# Patient Record
Sex: Female | Born: 1990 | Race: White | Hispanic: No | Marital: Single | Smoking: Never smoker
Health system: Southern US, Community
[De-identification: ages and names within clinical notes are randomized; demographics above are authoritative.]

---

## 2015-01-07 ENCOUNTER — Encounter: Payer: Self-pay | Admitting: Emergency Medicine

## 2015-01-07 ENCOUNTER — Ambulatory Visit
Admission: EM | Admit: 2015-01-07 | Discharge: 2015-01-07 | Disposition: A | Discharge status: Home / Self Care | Payer: Medicaid Other | Attending: Family Medicine | Admitting: Family Medicine

## 2015-01-07 DIAGNOSIS — J069 Acute upper respiratory infection, unspecified: Secondary | ICD-10-CM

## 2015-01-07 NOTE — ED Provider Notes (Signed)
CSN: [id number]     Arrival date & time 01/07/15  1510 History   First MD Initiated Contact with Patient 01/07/15 1630     Chief Complaint  Patient presents with  . Sore Throat  . Headache  . Nasal Congestion   (Consider location/radiation/quality/duration/timing/severity/associated sxs/prior Treatment) HPI Comments: 24 yo female with a 2 days h/o runny nose and congestion. States yesterday had sore throat but now resolved. Denies any fevers, chills, chest pains, shortness of breath, cough.   The history is provided by the patient.    History reviewed. No pertinent past medical history. History reviewed. No pertinent past surgical history. History reviewed. No pertinent family history. Social History  Substance Use Topics  . Smoking status: Never Smoker   . Smokeless tobacco: None  . Alcohol Use: No   OB History    No data available     Review of Systems  Allergies  Review of patient's allergies indicates no known allergies.  Home Medications   Prior to Admission medications   Medication Sig Start Date End Date Taking? Authorizing Provider  levonorgestrel (MIRENA) 20 MCG/24HR IUD 1 each by Intrauterine route once.   Yes Historical Provider, MD   Meds Ordered and Administered this Visit  Medications - No data to display  BP 138/74 mmHg  Pulse 106  Temp(Src) 98.6 F (37 C) (Tympanic)  Resp 16  Ht  (1.549 m)  Wt 182 lb (82.555 kg)  BMI 34.41 kg/m2  SpO2 99% No data found.   Physical Exam  Constitutional: She appears well-developed and well-nourished. No distress.  HENT:  Head: Normocephalic and atraumatic.  Right Ear: Tympanic membrane, external ear and ear canal normal.  Left Ear: Tympanic membrane, external ear and ear canal normal.  Nose: Rhinorrhea present. No nose lacerations, sinus tenderness, nasal deformity, septal deviation or nasal septal hematoma. No epistaxis.  No foreign bodies.  Mouth/Throat: Uvula is midline and mucous membranes are  normal. Posterior oropharyngeal erythema present. No oropharyngeal exudate, posterior oropharyngeal edema or tonsillar abscesses.  Eyes: Conjunctivae and EOM are normal. Pupils are equal, round, and reactive to light. Right eye exhibits no discharge. Left eye exhibits no discharge. No scleral icterus.  Neck: Normal range of motion. Neck supple. No thyromegaly present.  Cardiovascular: Normal rate, regular rhythm and normal heart sounds.   Pulmonary/Chest: Effort normal and breath sounds normal. No respiratory distress. She has no wheezes. She has no rales.  Lymphadenopathy:    She has no cervical adenopathy.  Skin: She is not diaphoretic.  Nursing note and vitals reviewed.   ED Course  Procedures (including critical care time)  Labs Review Labs Reviewed - No data to display  Imaging Review No results found.   Visual Acuity Review  Right Eye Distance:   Left Eye Distance:   Bilateral Distance:    Right Eye Near:   Left Eye Near:    Bilateral Near:         MDM   1. Viral URI    Discharge Medication List as of 01/07/2015  5:00 PM     Plan: 1.  diagnosis reviewed with patient 2. Recommend supportive treatment with otc analgesics, rest, increased fluids 3. F/u prn if symptoms worsen or don't improve    Payton Mccallum, MD 01/07/15 [id number]

## 2015-01-07 NOTE — ED Notes (Signed)
Patient c/o sore throat, runny nose, and HAs since yesterday.

## 2015-03-09 ENCOUNTER — Emergency Department
Admission: EM | Admit: 2015-03-09 | Discharge: 2015-03-09 | Disposition: A | Discharge status: Home / Self Care | Payer: Medicaid Other | Attending: Emergency Medicine | Admitting: Emergency Medicine

## 2015-03-09 ENCOUNTER — Encounter: Payer: Self-pay | Admitting: Emergency Medicine

## 2015-03-09 DIAGNOSIS — N390 Urinary tract infection, site not specified: Secondary | ICD-10-CM | POA: Diagnosis not present

## 2015-03-09 DIAGNOSIS — Z3202 Encounter for pregnancy test, result negative: Secondary | ICD-10-CM | POA: Insufficient documentation

## 2015-03-09 DIAGNOSIS — N939 Abnormal uterine and vaginal bleeding, unspecified: Secondary | ICD-10-CM | POA: Insufficient documentation

## 2015-03-09 DIAGNOSIS — R350 Frequency of micturition: Secondary | ICD-10-CM | POA: Diagnosis present

## 2015-03-09 DIAGNOSIS — Z79899 Other long term (current) drug therapy: Secondary | ICD-10-CM | POA: Diagnosis not present

## 2015-03-09 LAB — CBC
HEMATOCRIT: 40.9 % (ref 35.0–47.0)
HEMOGLOBIN: 13.4 g/dL (ref 12.0–16.0)
MCH: 26.9 pg (ref 26.0–34.0)
MCHC: 32.8 g/dL (ref 32.0–36.0)
MCV: 82.2 fL (ref 80.0–100.0)
Platelets: 246 10*3/uL (ref 150–440)
RBC: 4.97 MIL/uL (ref 3.80–5.20)
RDW: 14.6 % — AB (ref 11.5–14.5)
WBC: 10.4 10*3/uL (ref 3.6–11.0)

## 2015-03-09 LAB — BASIC METABOLIC PANEL
ANION GAP: 6 (ref 5–15)
BUN: 15 mg/dL (ref 6–20)
CALCIUM: 9.8 mg/dL (ref 8.9–10.3)
CHLORIDE: 105 mmol/L (ref 101–111)
CO2: 26 mmol/L (ref 22–32)
Creatinine, Ser: 0.68 mg/dL (ref 0.44–1.00)
GFR calc non Af Amer: >60 mL/min (ref >60)
GLUCOSE: 101 mg/dL — AB (ref 65–99)
POTASSIUM: 3.6 mmol/L (ref 3.5–5.1)
Sodium: 137 mmol/L (ref 135–145)

## 2015-03-09 LAB — URINALYSIS COMPLETE WITH MICROSCOPIC (ARMC ONLY)
Bilirubin Urine: NEGATIVE
GLUCOSE, UA: NEGATIVE mg/dL
Ketones, ur: NEGATIVE mg/dL
Nitrite: NEGATIVE
PROTEIN: 100 mg/dL — AB
Specific Gravity, Urine: 1.026 (ref 1.005–1.030)
pH: 6 (ref 5.0–8.0)

## 2015-03-09 LAB — POCT PREGNANCY, URINE: Preg Test, Ur: NEGATIVE

## 2015-03-09 MED ORDER — PHENAZOPYRIDINE HCL 100 MG PO TABS: 100.0000 mg | ORAL_TABLET | Freq: Three times a day (TID) | ORAL | Status: DC | PRN

## 2015-03-09 MED ORDER — SULFAMETHOXAZOLE-TRIMETHOPRIM 800-160 MG PO TABS
1.0000 | ORAL_TABLET | Freq: Two times a day (BID) | ORAL | Status: DC
Start: 2015-03-09 — End: 2015-05-13

## 2015-03-09 MED ORDER — PHENAZOPYRIDINE HCL 200 MG PO TABS
ORAL_TABLET | ORAL | Status: AC
  Filled 2015-03-09: qty 1

## 2015-03-09 MED ORDER — SULFAMETHOXAZOLE-TRIMETHOPRIM 800-160 MG PO TABS
1.0000 | ORAL_TABLET | Freq: Once | ORAL | Status: AC
  Administered 2015-03-09: 1 via ORAL
  Filled 2015-03-09: qty 1

## 2015-03-09 MED ORDER — PHENAZOPYRIDINE HCL 100 MG PO TABS
95.0000 mg | ORAL_TABLET | Freq: Once | ORAL | Status: AC
  Administered 2015-03-09: 100 mg via ORAL

## 2015-03-09 NOTE — ED Notes (Signed)
Pt reports dysuria for 2 weeks.  Pt taking cranberry pills without relief.   Pt reports bleeding when wiping.  Pt has an IUD.  No n/v/d.  No vag discharge.  No abd pain or back pain.

## 2015-03-09 NOTE — Discharge Instructions (Signed)
Dysuria °Dysuria is pain or discomfort while urinating. The pain or discomfort may be felt in the tube that carries urine out of the bladder (urethra) or in the surrounding tissue of the genitals. The pain may also be felt in the groin area, lower abdomen, and lower back. You may have to urinate frequently or have the sudden feeling that you have to urinate (urgency). Dysuria can affect both men and women, but is more common in women. °Dysuria can be caused by many different things, including: °· Urinary tract infection in women. °· Infection of the kidney or bladder. °· Kidney stones or bladder stones. °· Certain sexually transmitted infections (STIs), such as chlamydia. °· Dehydration. °· Inflammation of the vagina. °· Use of certain medicines. °· Use of certain soaps or scented products that cause irritation. °HOME CARE INSTRUCTIONS °Watch your dysuria for any changes. The following actions may help to reduce any discomfort you are feeling: °· Drink enough fluid to keep your urine clear or pale yellow. °· Empty your bladder often. Avoid holding urine for long periods of time. °· After a bowel movement or urination, women should cleanse from front to back, using each tissue only once. °· Empty your bladder after sexual intercourse. °· Take medicines only as directed by your health care provider. °· If you were prescribed an antibiotic medicine, finish it all even if you start to feel better. °· Avoid caffeine, tea, and alcohol. They can irritate the bladder and make dysuria worse. In men, alcohol may irritate the prostate. °· Keep all follow-up visits as directed by your health care provider. This is important. °· If you had any tests done to find the cause of dysuria, it is your responsibility to obtain your test results. Ask the lab or department performing the test when and how you will get your results. Talk with your health care provider if you have any questions about your results. °SEEK MEDICAL CARE  IF: °· You develop pain in your back or sides. °· You have a fever. °· You have nausea or vomiting. °· You have blood in your urine. °· You are not urinating as often as you usually do. °SEEK IMMEDIATE MEDICAL CARE IF: °· You pain is severe and not relieved with medicines. °· You are unable to hold down any fluids. °· You or someone else notices a change in your mental function. °· You have a rapid heartbeat at rest. °· You have shaking or chills. °· You feel extremely weak. °  °This information is not intended to replace advice given to you by your health care provider. Make sure you discuss any questions you have with your health care provider. °  °Document Released: 12/31/2003 Document Revised: 04/24/2014 Document Reviewed: 11/27/2013 °Elsevier Interactive Patient Education ©2016 [employer] ° °Urinary Tract Infection °Urinary tract infections (UTIs) can develop anywhere along your urinary tract. Your urinary tract is your body's drainage system for removing wastes and extra water. Your urinary tract includes two kidneys, two ureters, a bladder, and a urethra. Your kidneys are a pair of bean-shaped organs. Each kidney is about the size of your fist. They are located below your ribs, one on each side of your spine. °CAUSES °Infections are caused by microbes, which are microscopic organisms, including fungi, viruses, and bacteria. These organisms are so small that they can only be seen through a microscope. Bacteria are the microbes that most commonly cause UTIs. °SYMPTOMS  °Symptoms of UTIs may vary by age and gender of the patient   and by the location of the infection. Symptoms in young women typically include a frequent and intense urge to urinate and a painful, burning feeling in the bladder or urethra during urination. Older women and men are more likely to be tired, shaky, and weak and have muscle aches and abdominal pain. A fever may mean the infection is in your kidneys. Other symptoms of a kidney  infection include pain in your back or sides below the ribs, nausea, and vomiting. °DIAGNOSIS °To diagnose a UTI, your caregiver will ask you about your symptoms. Your caregiver will also ask you to provide a urine sample. The urine sample will be tested for bacteria and white blood cells. White blood cells are made by your body to help fight infection. °TREATMENT  °Typically, UTIs can be treated with medication. Because most UTIs are caused by a bacterial infection, they usually can be treated with the use of antibiotics. The choice of antibiotic and length of treatment depend on your symptoms and the type of bacteria causing your infection. °HOME CARE INSTRUCTIONS °· If you were prescribed antibiotics, take them exactly as your caregiver instructs you. Finish the medication even if you feel better after you have only taken some of the medication. °· Drink enough water and fluids to keep your urine clear or pale yellow. °· Avoid caffeine, tea, and carbonated beverages. They tend to irritate your bladder. °· Empty your bladder often. Avoid holding urine for long periods of time. °· Empty your bladder before and after sexual intercourse. °· After a bowel movement, women should cleanse from front to back. Use each tissue only once. °SEEK MEDICAL CARE IF:  °· You have back pain. °· You develop a fever. °· Your symptoms do not begin to resolve within 3 days. °SEEK IMMEDIATE MEDICAL CARE IF:  °· You have severe back pain or lower abdominal pain. °· You develop chills. °· You have nausea or vomiting. °· You have continued burning or discomfort with urination. °MAKE SURE YOU:  °· Understand these instructions. °· Will watch your condition. °· Will get help right away if you are not doing well or get worse. °  °This information is not intended to replace advice given to you by your health care provider. Make sure you discuss any questions you have with your health care provider. °  °Document Released: 01/11/2005 Document  Revised: 12/23/2014 Document Reviewed: 05/12/2011 °Elsevier Interactive Patient Education ©2016 Elsevier Inc. ° °

## 2015-03-09 NOTE — ED Notes (Addendum)
Patient ambulatory to triage with steady gait, without difficulty or distress noted; pt reports dysuria x 2 wks, now with vag bleeding/pain & pressure

## 2015-03-09 NOTE — ED Provider Notes (Signed)
[facility] Emergency Department Provider Note  ____________________________________________  Time seen: Approximately 135 AM  I have reviewed the triage vital signs and the nursing notes.   HISTORY  Chief Complaint Urinary Frequency and Vaginal Bleeding    HPI Barbara Mueller is a 24 y.o. female who comes into the hospital today with a concern that she has a bad UTI. The patient is had pain with urination for the last 2 weeks and small amounts of urine. The patient reports that she has an IUD and also has had some vaginal bleeding in the last week. She reports that she's had no cycle from some time but was concerned when she started having some light bleeding a week ago. She reports that the bleeding became heavier but never enough that she needs the pad and then became light again. The patient denies any abdominal or back pain. The patient thought that her UTI was getting better but then realized it was getting worse. The patient has been taking cranberry pills and Tylenol she's had some shooting pains up into her abdomen but nothing currently. The patient denies any fevers or vomiting. The patient is here for evaluation.   History reviewed. No pertinent past medical history.  There are no active problems to display for this patient.   History reviewed. No pertinent past surgical history.  Current Outpatient Rx  Name  Route  Sig  Dispense  Refill  . levonorgestrel (MIRENA) 20 MCG/24HR IUD   Intrauterine   1 each by Intrauterine route once.         . phenazopyridine (PYRIDIUM) 100 MG tablet   Oral   Take 1 tablet (100 mg total) by mouth 3 (three) times daily as needed for pain.   20 tablet   0   . sulfamethoxazole-trimethoprim (BACTRIM DS,SEPTRA DS) 800-160 MG tablet   Oral   Take 1 tablet by mouth 2 (two) times daily.   10 tablet   0     Allergies Review of patient's allergies indicates no known allergies.  No family history on  file.  Social History Social History  Substance Use Topics  . Smoking status: Never Smoker   . Smokeless tobacco: None  . Alcohol Use: No    Review of Systems Constitutional: No fever/chills Eyes: No visual changes. ENT: No sore throat. Cardiovascular: Denies chest pain. Respiratory: Denies shortness of breath. Gastrointestinal: No abdominal pain.  No nausea, no vomiting.  No diarrhea.  No constipation. Genitourinary: dysuria and vaginal bleeding Musculoskeletal: Negative for back pain. Skin: Negative for rash. Neurological: Negative for headaches, focal weakness or numbness.  10-point ROS otherwise negative.  ____________________________________________   PHYSICAL EXAM:  VITAL SIGNS: ED Triage Vitals  Enc Vitals Group     BP 03/09/15 0104 141/99 mmHg     Pulse Rate 03/09/15 0104 84     Resp 03/09/15 0104 18     Temp 03/09/15 0104 98 F (36.7 C)     Temp Source 03/09/15 0104 Oral     SpO2 03/09/15 0104 99 %     Weight 03/09/15 0104 170 lb (77.111 kg)     Height 03/09/15 0104  (1.549 m)     Head Cir --      Peak Flow --      Pain Score 03/09/15 0104 3     Pain Loc --      Pain Edu? --      Excl. in GC? --     Constitutional: Alert and  oriented. Well appearing and in no acute distress. Eyes: Conjunctivae are normal. PERRL. EOMI. Head: Atraumatic. Nose: No congestion/rhinnorhea. Mouth/Throat: Mucous membranes are moist.  Oropharynx non-erythematous. Cardiovascular: Normal rate, regular rhythm. Grossly normal heart sounds.  Good peripheral circulation. Respiratory: Normal respiratory effort.  No retractions. Lungs CTAB. Gastrointestinal: Soft and nontender. No distention. Positive bowel sounds, no CVA tenderness Musculoskeletal: No lower extremity tenderness nor edema.   Neurologic:  Normal speech and language.  Skin:  Skin is warm, dry and intact. Marland Kitchen. Psychiatric: Mood and affect are normal.   ____________________________________________   LABS (all  labs ordered are listed, but only abnormal results are displayed)  Labs Reviewed  URINALYSIS COMPLETEWITH MICROSCOPIC ([facility] ONLY) - Abnormal; Notable for the following:    Color, Urine YELLOW (*)    APPearance CLOUDY (*)    Hgb urine dipstick 3+ (*)    Protein, ur 100 (*)    Leukocytes, UA 3+ (*)    Bacteria, UA RARE (*)    Squamous Epithelial / LPF 0-5 (*)    All other components within normal limits  CBC - Abnormal; Notable for the following:    RDW 14.6 (*)    All other components within normal limits  BASIC METABOLIC PANEL - Abnormal; Notable for the following:    Glucose, Bld 101 (*)    All other components within normal limits  URINE CULTURE  POC URINE PREG, ED  POCT PREGNANCY, URINE   ____________________________________________  EKG  None ____________________________________________  RADIOLOGY  None ____________________________________________   PROCEDURES  Procedure(s) performed: None  Critical Care performed: No  ____________________________________________   INITIAL IMPRESSION / ASSESSMENT AND PLAN / ED COURSE  Pertinent labs & imaging results that were available during my care of the patient were reviewed by me and considered in my medical decision making (see chart for details).  This is a 24 year old female who comes in today with dysuria and a concern that she has a UTI. The patient does have 2 numerous to count whites and reds in her urine. I will give the patient a dose of Bactrim and Pyridium and I will discharge the patient with Bactrim and peridium for home. I asked the patient if she wanted me to do a pelvic exam as she was having this bleeding but I did explain to the patient that with the Mirena she still can have periods. The patient reports that since the bleeding was very light and not concerning that she did not want to have a pelvic exam at this time. The patient reports that if it becomes worse or return she will follow-up with her GYN.  Patient be discharged to home with antibiotics. ____________________________________________   FINAL CLINICAL IMPRESSION(S) / ED DIAGNOSES  Final diagnoses:  UTI (lower urinary tract infection)      Rebecka ApleyAllison P Juliocesar Blasius, MD 03/09/15 [phone]

## 2015-03-11 LAB — URINE CULTURE
Culture: 40000
SPECIAL REQUESTS: NORMAL

## 2015-04-14 ENCOUNTER — Encounter: Payer: Self-pay | Admitting: Emergency Medicine

## 2015-04-14 ENCOUNTER — Ambulatory Visit
Admission: EM | Admit: 2015-04-14 | Discharge: 2015-04-14 | Disposition: A | Discharge status: Home / Self Care | Payer: Medicaid Other | Attending: Family Medicine | Admitting: Family Medicine

## 2015-04-14 ENCOUNTER — Ambulatory Visit: Payer: Medicaid Other

## 2015-04-14 DIAGNOSIS — M25532 Pain in left wrist: Secondary | ICD-10-CM | POA: Diagnosis present

## 2015-04-14 DIAGNOSIS — M67432 Ganglion, left wrist: Secondary | ICD-10-CM

## 2015-04-14 MED ORDER — MELOXICAM 15 MG PO TABS: 15.0000 mg | ORAL_TABLET | Freq: Every day | ORAL | Status: DC | PRN

## 2015-04-14 NOTE — Discharge Instructions (Signed)
Tke medication as prescribed. Wear wrist splint. Apply ice. Avoid repetitive motions.  Follow-up with orthopedic next week as discussed. Return to urgent care as needed for new or worsening concerns.  Ganglion Cyst A ganglion cyst is a noncancerous, fluid-filled lump that occurs near joints or tendons. The ganglion cyst grows out of a joint or the lining of a tendon. It most often develops in the hand or wrist, but it can also develop in the shoulder, elbow, hip, knee, ankle, or foot. The round or oval ganglion cyst can be the size of a pea or larger than a grape. Increased activity may enlarge the size of the cyst because more fluid starts to build up.  CAUSES It is not known what causes a ganglion cyst to grow. However, it may be related to:  Inflammation or irritation around the joint.  An injury.  Repetitive movements or overuse.  Arthritis. RISK FACTORS Risk factors include:  Being a woman.  Being age 23-50. SIGNS AND SYMPTOMS Symptoms may include:   A lump. This most often appears on the hand or wrist, but it can occur in other areas of the body.  Tingling.  Pain.  Numbness.  Muscle weakness.  Weak grip.  Less movement in a joint. DIAGNOSIS Ganglion cysts are most often diagnosed based on a physical exam. Your health care provider will feel the lump and may shine a light alongside it. If it is a ganglion cyst, a light often shines through it. Your health care provider may order an X-ray, ultrasound, or MRI to rule out other conditions. TREATMENT Ganglion cysts usually go away on their own without treatment. If pain or other symptoms are involved, treatment may be needed. Treatment is also needed if the ganglion cyst limits your movement or if it gets infected. Treatment may include:  Wearing a brace or splint on your wrist or finger.  Taking anti-inflammatory medicine.  Draining fluid from the lump with a needle (aspiration).  Injecting a steroid into the  joint.  Surgery to remove the ganglion cyst. HOME CARE INSTRUCTIONS  Do not press on the ganglion cyst, poke it with a needle, or hit it.  Take medicines only as directed by your health care provider.  Wear your brace or splint as directed by your health care provider.  Watch your ganglion cyst for any changes.  Keep all follow-up visits as directed by your health care provider. This is important. SEEK MEDICAL CARE IF:  Your ganglion cyst becomes larger or more painful.  You have increased redness, red streaks, or swelling.  You have pus coming from the lump.  You have weakness or numbness in the affected area.  You have a fever or chills.   This information is not intended to replace advice given to you by your health care provider. Make sure you discuss any questions you have with your health care provider.   Document Released: 03/31/2000 Document Revised: 04/24/2014 Document Reviewed: 09/16/2013 Elsevier Interactive Patient Education 2016 [employer] Inc.  Wrist Pain There are many things that can cause wrist pain. Some common causes include:  An injury to the wrist area.  Overuse of the joint.  A condition that causes too much pressure to be put on a nerve in the wrist (carpal tunnel syndrome).  Wear and tear of the joints that happens as a person gets older (osteoarthritis).  Other types of arthritis. Sometimes, the cause is not known. The pain often goes away when you follow instructions from your doctor about  relieving pain at home. If your wrist pain does not go away, tests may need to be done to find the cause. HOME CARE Pay attention to any changes in your symptoms. Take these actions to help with your pain:  Rest your wrist for at least 48 hours or as told by your doctor.  If your doctor tells you to, put ice on the injured area:  Put ice in a plastic bag.  Place a towel between your skin and the bag.  Leave the ice on for 20 minutes, 2-3 times per  day.  Keep your arm raised (elevated) above the level of your heart while you are sitting or lying down.  If a splint or elastic bandage has been put on the injured area:  Wear it as told by your doctor.  Take the splint or bandage off only as told by your doctor.  Loosen the splint or bandage if your fingers lose feeling (are numb) or have a tingling feeling, or if they turn cold or blue.  Take over-the-counter and prescription medicines only as told by your doctor.  Keep all follow-up visits as told by your doctor. This is important. GET HELP IF:  Your pain is not helped by treatment.  Your pain gets worse. GET HELP RIGHT AWAY IF:   Your fingers swell.  Your fingers turn white, very red, or cold and blue.  Your fingers lose feeling or have a tingling feeling.  You have trouble moving your fingers.   This information is not intended to replace advice given to you by your health care provider. Make sure you discuss any questions you have with your health care provider.   Document Released: 09/20/2007 Document Revised: 12/23/2014 Document Reviewed: 08/19/2014 Elsevier Interactive Patient Education [employer]

## 2015-04-14 NOTE — ED Notes (Addendum)
Pt reports knot on left wrist/arm, painful.  Pt states Left wrist pain since June, now worse.

## 2015-04-14 NOTE — ED Provider Notes (Signed)
[facility]  ____________________________________________  Time seen: Approximately 5:23 PM  I have reviewed the triage vital signs and the nursing notes.   HISTORY  Chief Complaint Cyst and Wrist Pain   HPI Barbara Mueller is a 24 y.o. female  presents with complaints of left wrist pain. Patient reports that she has had intermittent left wrist pain for the last 7 months. Patient denies fall or injury. Patient also reports that she's noticed that there is a knot on her left wrist that sometimes gets a bit bigger and then smaller. Denies numbness or tingling sensation. Denies decreased range of motion. States pain is not constant but intermittent. States that current pain is 4 out of 10. Denies other pain or injuries. Patient reports she is right-hand dominant.   History reviewed. No pertinent past medical history.  There are no active problems to display for this patient.   History reviewed. No pertinent past surgical history.  Current Outpatient Rx  Name  Route  Sig  Dispense  Refill  . levonorgestrel (MIRENA) 20 MCG/24HR IUD   Intrauterine   1 each by Intrauterine route once.         .           .            States not breastfeeding. Denies chance of pregnancy.  Allergies Latex  History reviewed. No pertinent family history.  Social History Social History  Substance Use Topics  . Smoking status: Never Smoker   . Smokeless tobacco: None  . Alcohol Use: No    Review of Systems Constitutional: No fever/chills Eyes: No visual changes. ENT: No sore throat. Cardiovascular: Denies chest pain. Respiratory: Denies shortness of breath. Gastrointestinal: No abdominal pain.  No nausea, no vomiting.  No diarrhea.  No constipation. Genitourinary: Negative for dysuria. Musculoskeletal: Negative for back pain. Positive left wrist pain. Skin: Negative for rash. Neurological: Negative for headaches, focal weakness or numbness.  10-point ROS otherwise  negative.  ____________________________________________   PHYSICAL EXAM:  VITAL SIGNS: ED Triage Vitals  Enc Vitals Group     BP 04/14/15 1553 129/64 mmHg     Pulse Rate 04/14/15 1553 83     Resp 04/14/15 1553 16     Temp 04/14/15 1553 97.8 F (36.6 C)     Temp Source 04/14/15 1553 Oral     SpO2 04/14/15 1553 100 %     Weight 04/14/15 1553 170 lb (77.111 kg)     Height 04/14/15 1553 5\' 1"  (1.549 m)     Head Cir --      Peak Flow --      Pain Score 04/14/15 1557 6     Pain Loc --      Pain Edu? --      Excl. in GC? --     Constitutional: Alert and oriented. Well appearing and in no acute distress. Eyes: Conjunctivae are normal. PERRL. EOMI. Head: Atraumatic.  Nose: No congestion/rhinnorhea.  Mouth/Throat: Mucous membranes are moist.  Oropharynx non-erythematous. Neck: No stridor.  No cervical spine tenderness to palpation. Cardiovascular: Normal rate, regular rhythm. Grossly normal heart sounds.  Good peripheral circulation. Respiratory: Normal respiratory effort.  No retractions. Lungs CTAB. Gastrointestinal: Soft and nontender. Musculoskeletal: No lower or upper extremity tenderness nor edema.  No cervical, thoracic or lumbar tenderness to palpation. Except: Left distal dorsal wrist area approximately 1.5 x 1.5 cm palpated that illuminates, mild to moderate tenderness to direct palpation at that site, no surrounding erythema, no  surrounding swelling or tenderness, full range of motion present, distal radial pulses equal bilaterally, bilateral hand grips equal, patient fully able to touch left thumb to each fingertip without discomfort or difficulty. Neurologic:  Normal speech and language. No gross focal neurologic deficits are appreciated. No gait instability. Skin:  Skin is warm, dry and intact. No rash noted. Psychiatric: Mood and affect are normal. Speech and behavior are normal.  ____________________________________________   LABS (all labs ordered are listed, but  only abnormal results are displayed)  Labs Reviewed - No data to display  RADIOLOGY  EXAM: LEFT WRIST - COMPLETE 3+ VIEW  COMPARISON: None.  FINDINGS: There is no evidence of fracture or dislocation. There is no evidence of arthropathy or other focal bone abnormality. Soft tissues are unremarkable.  IMPRESSION: Negative.   Electronically Signed By: Esperanza Heir M.D. On: 04/14/2015 17:14  I, Renford Dills, personally viewed and evaluated these images (plain radiographs) as part of my medical decision making, as well as reviewing the written report by the radiologist. ____________________________________________   PROCEDURES  Procedure(s) performed:  Left Velcro cock up splint applied by RN. Neurovascular intact post application.   _____________________________   INITIAL IMPRESSION / ASSESSMENT AND PLAN / ED COURSE  Pertinent labs & imaging results that were available during my care of the patient were reviewed by me and considered in my medical decision making (see chart for details).  Very well-appearing patient. No acute distress. Presents for complaint of left dorsal wrist pain times several months. Denies fall or known trauma. Suspect ganglion cyst. Will evaluate x-ray to rule out bony abnormality  Left wrist x-ray negative. Will treat patient conservatively with Velcro splint as well as oral Mobic. Apply ice, rest. Encouraged patient to follow-up with orthopedic. Information for orthopedic Dr. Joice Lofts given. Patient verbalized understanding and agreed to this plan.   Discussed follow up with Primary care physician this week. Discussed follow up and return parameters including no resolution or any worsening concerns. Patient verbalized understanding and agreed to plan.   ____________________________________________   FINAL CLINICAL IMPRESSION(S) / ED DIAGNOSES  Final diagnoses:  Left wrist pain  Ganglion of left wrist       Renford Dills,  NP 04/14/15 1749

## 2015-05-13 ENCOUNTER — Ambulatory Visit (INDEPENDENT_AMBULATORY_CARE_PROVIDER_SITE_OTHER): Payer: Medicaid Other | Admitting: Family Medicine

## 2015-05-13 ENCOUNTER — Encounter: Payer: Self-pay | Admitting: Family Medicine

## 2015-05-13 VITALS — BP 118/80 | HR 105 | Resp 16 | Ht 61.0 in | Wt 171.8 lb

## 2015-05-13 DIAGNOSIS — E669 Obesity, unspecified: Secondary | ICD-10-CM

## 2015-05-13 DIAGNOSIS — E559 Vitamin D deficiency, unspecified: Secondary | ICD-10-CM

## 2015-05-13 DIAGNOSIS — F329 Major depressive disorder, single episode, unspecified: Secondary | ICD-10-CM | POA: Diagnosis not present

## 2015-05-13 DIAGNOSIS — F32A Depression, unspecified: Secondary | ICD-10-CM | POA: Insufficient documentation

## 2015-05-13 MED ORDER — SERTRALINE HCL 50 MG PO TABS: 50.0000 mg | ORAL_TABLET | Freq: Every day | ORAL | Status: DC

## 2015-05-13 NOTE — Progress Notes (Signed)
Date:  05/13/2015   Name:  Barbara Mueller   DOB:  21-May-1990   MRN:  [id number]  PCP:  No PCP Per Patient    Chief Complaint: Depression and Establish Care   History of Present Illness:  This is a 25 y.o. female to establish care. C/o insomnia, fatigue, irritability, decreased appetite since delivery of first child 15 months ago. Seemed to be improving but now has had to put restraining order on child's father. No depression rx in past. Adopted, FH unknown. Thinks had tetanus imm while pregnant  Review of Systems:  Review of Systems  Respiratory: Negative for shortness of breath.   Cardiovascular: Negative for chest pain and leg swelling.  Neurological: Negative for syncope and light-headedness.  Psychiatric/Behavioral: Negative for suicidal ideas and self-injury.    Patient Active Problem List   Diagnosis Date Noted  . Depression 05/13/2015  . Obesity, Class I, BMI 30-34.9 05/13/2015    Prior to Admission medications   Medication Sig Start Date End Date Taking? Authorizing Provider  levonorgestrel (MIRENA) 20 MCG/24HR IUD 1 each by Intrauterine route once.   Yes Historical Provider, MD  sertraline (ZOLOFT) 50 MG tablet Take 1 tablet (50 mg total) by mouth daily. 05/13/15   Schuyler Amor, MD    Allergies  Allergen Reactions  . Latex Itching    History reviewed. No pertinent past surgical history.  Social History  Substance Use Topics  . Smoking status: Never Smoker   . Smokeless tobacco: Never Used  . Alcohol Use: No    Family History  Problem Relation Age of Onset  . Adopted: Yes  . Family history unknown: Yes    Medication list has been reviewed and updated.  Physical Examination: BP 118/80 mmHg  Pulse 105  Resp 16  Ht  (1.549 m)  Wt 171 lb 12.8 oz (77.928 kg)  BMI 32.48 kg/m2  SpO2 97%  LMP 07/13/2014  Physical Exam  Constitutional: She is oriented to person, place, and time. She appears well-developed and well-nourished.  HENT:  Head:  Normocephalic and atraumatic.  Right Ear: External ear normal.  Left Ear: External ear normal.  Nose: Nose normal.  Mouth/Throat: Oropharynx is clear and moist.  TM's clear  Eyes: Conjunctivae and EOM are normal. Pupils are equal, round, and reactive to light.  Neck: Neck supple. No thyromegaly present.  Cardiovascular: Normal rate, regular rhythm and normal heart sounds.   Pulmonary/Chest: Effort normal and breath sounds normal.  Abdominal: Soft. She exhibits no distension and no mass. There is no tenderness.  Musculoskeletal: She exhibits no edema.  Lymphadenopathy:    She has no cervical adenopathy.  Neurological: She is alert and oriented to person, place, and time. Coordination normal.  Skin: Skin is warm and dry.  Psychiatric: She has a normal mood and affect. Her behavior is normal.  Nursing note and vitals reviewed.   Assessment and Plan:  1. Depression Postpartum, persistent, begin Zoloft - B12  2. Obesity, Class I, BMI 30-34.9 Exercise/weight loss discussed - TSH - Vitamin D (25 hydroxy) - Comprehensive Metabolic Panel (CMET) - CBC  Return in about 4 weeks (around 06/10/2015).  Dionne Ano. Kingsley Spittle MD [facility]  05/13/2015

## 2015-05-14 DIAGNOSIS — E559 Vitamin D deficiency, unspecified: Secondary | ICD-10-CM | POA: Insufficient documentation

## 2015-05-14 LAB — TSH: TSH: 2.33 u[IU]/mL (ref 0.450–4.500)

## 2015-05-14 LAB — COMPREHENSIVE METABOLIC PANEL
ALBUMIN: 4.8 g/dL (ref 3.5–5.5)
ALK PHOS: 68 IU/L (ref 39–117)
ALT: 13 IU/L (ref 0–32)
AST: 16 IU/L (ref 0–40)
Albumin/Globulin Ratio: 1.9 (ref 1.1–2.5)
BUN / CREAT RATIO: 13 (ref 8–20)
BUN: 9 mg/dL (ref 6–20)
Bilirubin Total: 0.6 mg/dL (ref 0.0–1.2)
CO2: 25 mmol/L (ref 18–29)
CREATININE: 0.67 mg/dL (ref 0.57–1.00)
Calcium: 9.7 mg/dL (ref 8.7–10.2)
Chloride: 103 mmol/L (ref 96–106)
GFR, EST AFRICAN AMERICAN: 143 mL/min/{1.73_m2} (ref >59)
GFR, EST NON AFRICAN AMERICAN: 124 mL/min/{1.73_m2} (ref >59)
GLOBULIN, TOTAL: 2.5 g/dL (ref 1.5–4.5)
Glucose: 86 mg/dL (ref 65–99)
Potassium: 4.1 mmol/L (ref 3.5–5.2)
SODIUM: 143 mmol/L (ref 134–144)
TOTAL PROTEIN: 7.3 g/dL (ref 6.0–8.5)

## 2015-05-14 LAB — CBC
HEMATOCRIT: 41.9 % (ref 34.0–46.6)
Hemoglobin: 13.5 g/dL (ref 11.1–15.9)
MCH: 28 pg (ref 26.6–33.0)
MCHC: 32.2 g/dL (ref 31.5–35.7)
MCV: 87 fL (ref 79–97)
PLATELETS: 266 10*3/uL (ref 150–379)
RBC: 4.82 x10E6/uL (ref 3.77–5.28)
RDW: 14.1 % (ref 12.3–15.4)
WBC: 7.3 10*3/uL (ref 3.4–10.8)

## 2015-05-14 LAB — VITAMIN D 25 HYDROXY (VIT D DEFICIENCY, FRACTURES): Vit D, 25-Hydroxy: 19.1 ng/mL — ABNORMAL LOW (ref 30.0–100.0)

## 2015-05-14 LAB — VITAMIN B12: Vitamin B-12: 425 pg/mL (ref 211–946)

## 2015-05-14 MED ORDER — VITAMIN D 50 MCG (2000 UT) PO CAPS: 1.0000 | ORAL_CAPSULE | Freq: Every day | ORAL | Status: AC

## 2015-05-14 NOTE — Addendum Note (Signed)
Addended by: Schuyler Amor on: 05/14/2015 09:15 AM   Modules accepted: Orders

## 2015-06-01 ENCOUNTER — Telehealth: Payer: Self-pay

## 2015-06-01 NOTE — Telephone Encounter (Signed)
Left message regarding Vit D 2000 units OTC needing added.

## 2015-06-10 ENCOUNTER — Ambulatory Visit: Payer: Medicaid Other | Admitting: Family Medicine

## 2015-06-11 ENCOUNTER — Encounter: Payer: Self-pay | Admitting: Family Medicine

## 2015-06-11 ENCOUNTER — Ambulatory Visit (INDEPENDENT_AMBULATORY_CARE_PROVIDER_SITE_OTHER): Payer: Medicaid Other | Admitting: Family Medicine

## 2015-06-11 VITALS — BP 120/86 | HR 76 | Resp 16 | Ht 61.0 in | Wt 166.0 lb

## 2015-06-11 DIAGNOSIS — E669 Obesity, unspecified: Secondary | ICD-10-CM

## 2015-06-11 DIAGNOSIS — F32A Depression, unspecified: Secondary | ICD-10-CM

## 2015-06-11 DIAGNOSIS — E559 Vitamin D deficiency, unspecified: Secondary | ICD-10-CM | POA: Diagnosis not present

## 2015-06-11 DIAGNOSIS — F329 Major depressive disorder, single episode, unspecified: Secondary | ICD-10-CM

## 2015-06-11 NOTE — Progress Notes (Signed)
Date:  06/11/2015   Name:  Barbara Mueller   DOB:  [birth year]   MRN:  [id number]  PCP:  No PCP Per Patient    Chief Complaint: Depression   History of Present Illness:  This is a 25 y.o. female started on Zoloft for postpartum depression one month ago, tolerating well, reports mood improved, still has ups and downs, can tell when forgets to take. Blood work showed vit D def, on supplement.  Review of Systems:  Review of Systems  Constitutional: Negative for unexpected weight change.  Gastrointestinal: Negative for nausea.  Neurological: Negative for light-headedness.    Patient Active Problem List   Diagnosis Date Noted  . Vitamin D deficiency 05/14/2015  . Depression 05/13/2015  . Obesity, Class I, BMI 30-34.9 05/13/2015    Prior to Admission medications   Medication Sig Start Date End Date Taking? Authorizing Provider  Cholecalciferol (VITAMIN D) 2000 units CAPS Take 1 capsule (2,000 Units total) by mouth daily. 05/14/15  Yes Schuyler Amor, MD  levonorgestrel (MIRENA) 20 MCG/24HR IUD 1 each by Intrauterine route once.   Yes Historical Provider, MD  sertraline (ZOLOFT) 50 MG tablet Take 1 tablet (50 mg total) by mouth daily. 05/13/15  Yes Schuyler Amor, MD    Allergies  Allergen Reactions  . Latex Itching    History reviewed. No pertinent past surgical history.  Social History  Substance Use Topics  . Smoking status: Never Smoker   . Smokeless tobacco: Never Used  . Alcohol Use: No    Family History  Problem Relation Age of Onset  . Adopted: Yes  . Family history unknown: Yes    Medication list has been reviewed and updated.  Physical Examination: BP 120/86 mmHg  Pulse 76  Resp 16  Ht  (1.549 m)  Wt 166 lb (75.297 kg)  BMI 31.38 kg/m2  Physical Exam  Constitutional: She appears well-developed and well-nourished.  Neurological: She is alert.  Psychiatric: She has a normal mood and affect. Her behavior is normal.  Nursing note and vitals  reviewed.   Assessment and Plan:  1. Depression Improved on Zoloft, cont current dose  2. Vitamin D deficiency On supplement  3. Obesity, Class I, BMI 30-34.9 Weight stable, trying to exercise more  Return in about 2 months (around 08/09/2015).  Dionne Ano. Kingsley Spittle MD [facility]  06/11/2015

## 2015-08-09 ENCOUNTER — Ambulatory Visit: Payer: Medicaid Other | Admitting: Family Medicine

## 2015-08-19 ENCOUNTER — Encounter: Payer: Self-pay | Admitting: Family Medicine

## 2015-08-19 ENCOUNTER — Ambulatory Visit (INDEPENDENT_AMBULATORY_CARE_PROVIDER_SITE_OTHER): Payer: Medicaid Other | Admitting: Family Medicine

## 2015-08-19 VITALS — BP 110/72 | HR 70 | Ht 61.0 in | Wt 169.0 lb

## 2015-08-19 DIAGNOSIS — E162 Hypoglycemia, unspecified: Secondary | ICD-10-CM | POA: Diagnosis not present

## 2015-08-19 DIAGNOSIS — F329 Major depressive disorder, single episode, unspecified: Secondary | ICD-10-CM

## 2015-08-19 DIAGNOSIS — E559 Vitamin D deficiency, unspecified: Secondary | ICD-10-CM | POA: Diagnosis not present

## 2015-08-19 DIAGNOSIS — E669 Obesity, unspecified: Secondary | ICD-10-CM

## 2015-08-19 DIAGNOSIS — F32A Depression, unspecified: Secondary | ICD-10-CM

## 2015-08-19 DIAGNOSIS — E161 Other hypoglycemia: Secondary | ICD-10-CM | POA: Insufficient documentation

## 2015-08-19 MED ORDER — SERTRALINE HCL 100 MG PO TABS: 100.0000 mg | ORAL_TABLET | Freq: Every day | ORAL | Status: DC

## 2015-08-19 NOTE — Progress Notes (Signed)
Date:  08/19/2015   Name:  Barbara Mueller   DOB:  02/23/1991   MRN:  [id number]  PCP:  No PCP Per Patient    Chief Complaint: Depression   History of Present Illness:  This is a 25 y.o. female on Zoloft since Jan for postpartum depression, was doing well initially but past month has had recurrent sxs. Also c/o feeling weak after eating large meal past few days. Was going to gym regularly but not lately, weight up 3#. Was taking vit D supp but not lately.  Review of Systems:  Review of Systems  Constitutional: Negative for fever.  Respiratory: Negative for cough and shortness of breath.   Cardiovascular: Negative for chest pain and leg swelling.  Endocrine: Negative for polyuria.  Genitourinary: Negative for difficulty urinating.  Neurological: Negative for syncope and light-headedness.    Patient Active Problem List   Diagnosis Date Noted  . Vitamin D deficiency 05/14/2015  . Depression 05/13/2015  . Obesity, Class I, BMI 30-34.9 05/13/2015    Prior to Admission medications   Medication Sig Start Date End Date Taking? Authorizing Provider  levonorgestrel (MIRENA) 20 MCG/24HR IUD 1 each by Intrauterine route once.   Yes Historical Provider, MD  sertraline (ZOLOFT) 100 MG tablet Take 1 tablet (100 mg total) by mouth daily. 08/19/15  Yes Schuyler AmorWilliam Hershall Benkert, MD  Cholecalciferol (VITAMIN D) 2000 units CAPS Take 1 capsule (2,000 Units total) by mouth daily. Patient not taking: Reported on 08/19/2015 05/14/15   Schuyler AmorWilliam Tearra Ouk, MD    Allergies  Allergen Reactions  . Latex Itching    History reviewed. No pertinent past surgical history.  Social History  Substance Use Topics  . Smoking status: Never Smoker   . Smokeless tobacco: Never Used  . Alcohol Use: No    Family History  Problem Relation Age of Onset  . Adopted: Yes  . Family history unknown: Yes    Medication list has been reviewed and updated.  Physical Examination: BP 110/72 mmHg  Pulse 70  Ht 5\' 1"  (1.549 m)  Wt 169  lb (76.658 kg)  BMI 31.95 kg/m2  Physical Exam  Constitutional: She appears well-developed and well-nourished.  Cardiovascular: Normal rate, regular rhythm and normal heart sounds.   Pulmonary/Chest: Effort normal and breath sounds normal.  Musculoskeletal: She exhibits no edema.  Neurological: She is alert.  Skin: Skin is warm and dry.  Psychiatric: She has a normal mood and affect. Her behavior is normal.  Nursing note and vitals reviewed.   Assessment and Plan:  1. Depression, postpartum Increase Zoloft to 100 mg daily, call if ineffective  2. Postprandial hypoglycemia Small frequent meals, call if sxs persist  3. Obesity, Class I, BMI 30-34.9 Encouraged exercise/weight loss  4. Vitamin D deficiency Encouraged taking supplement regularly  Return in about 3 months (around 11/19/2015).  Dionne AnoWilliam M. Kingsley SpittlePlonk, Jr. MD [facility]  08/19/2015

## 2015-10-20 ENCOUNTER — Other Ambulatory Visit: Payer: Self-pay

## 2015-10-20 MED ORDER — SERTRALINE HCL 100 MG PO TABS: 100.0000 mg | ORAL_TABLET | Freq: Every day | ORAL | Status: AC

## 2015-11-22 ENCOUNTER — Ambulatory Visit: Payer: Medicaid Other | Admitting: Family Medicine

## 2016-04-26 IMAGING — CR DG WRIST COMPLETE 3+V*L*
4 series · 4 of 4 positions shown · non-contrast
Comparison: None.

CLINICAL DATA: Pt states she has been having left dorsal wrist pain
Since she had a typing job in September 2014. But she went to pick up her
baby on [REDACTED] and felt a pop and has been hurting worse
since then. Pt has hard knot like area

EXAM:
LEFT WRIST - COMPLETE 3+ VIEW

[wrist pa (1 of 2)]
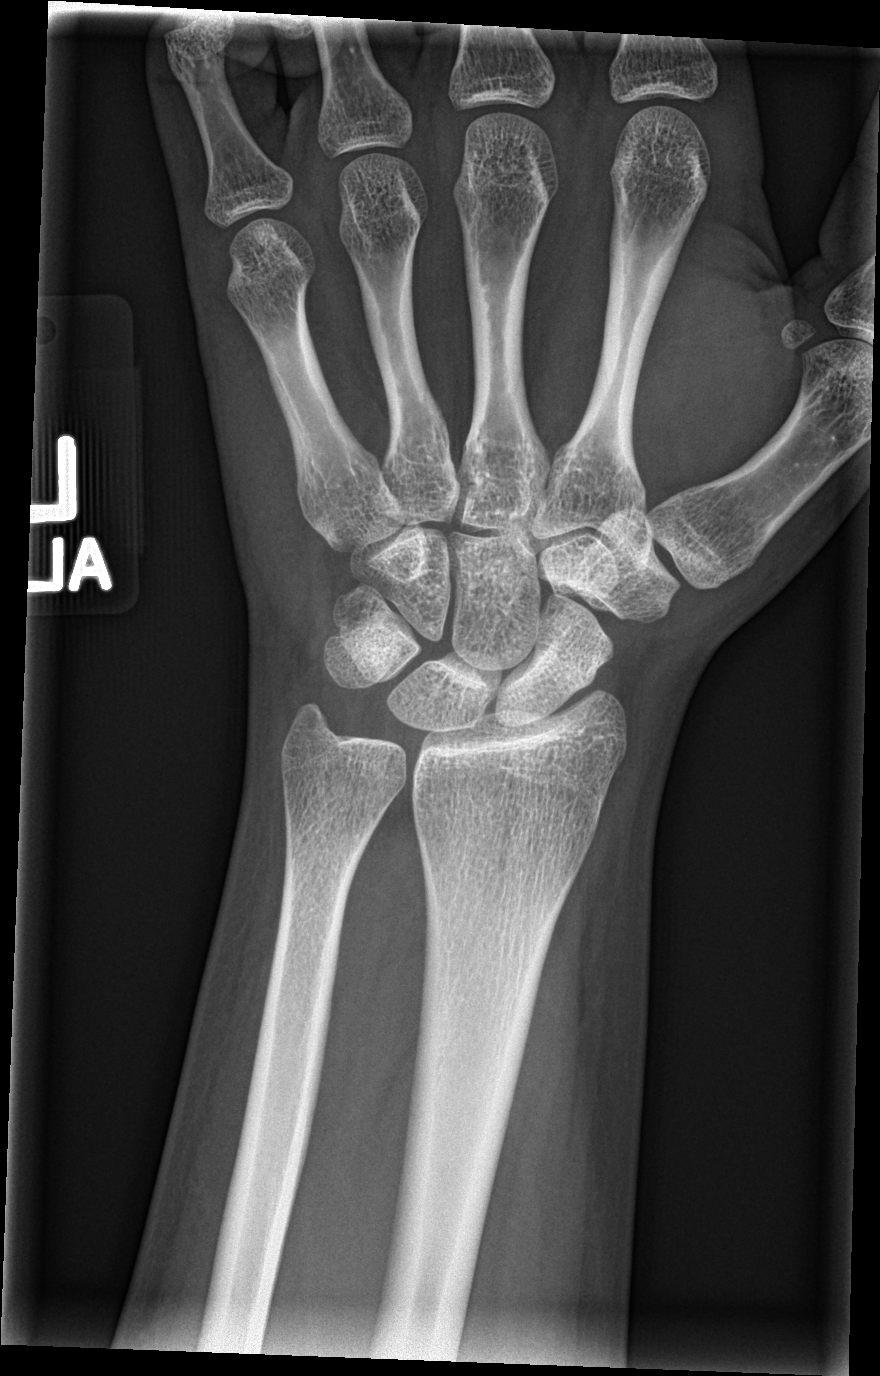

[wrist obl]
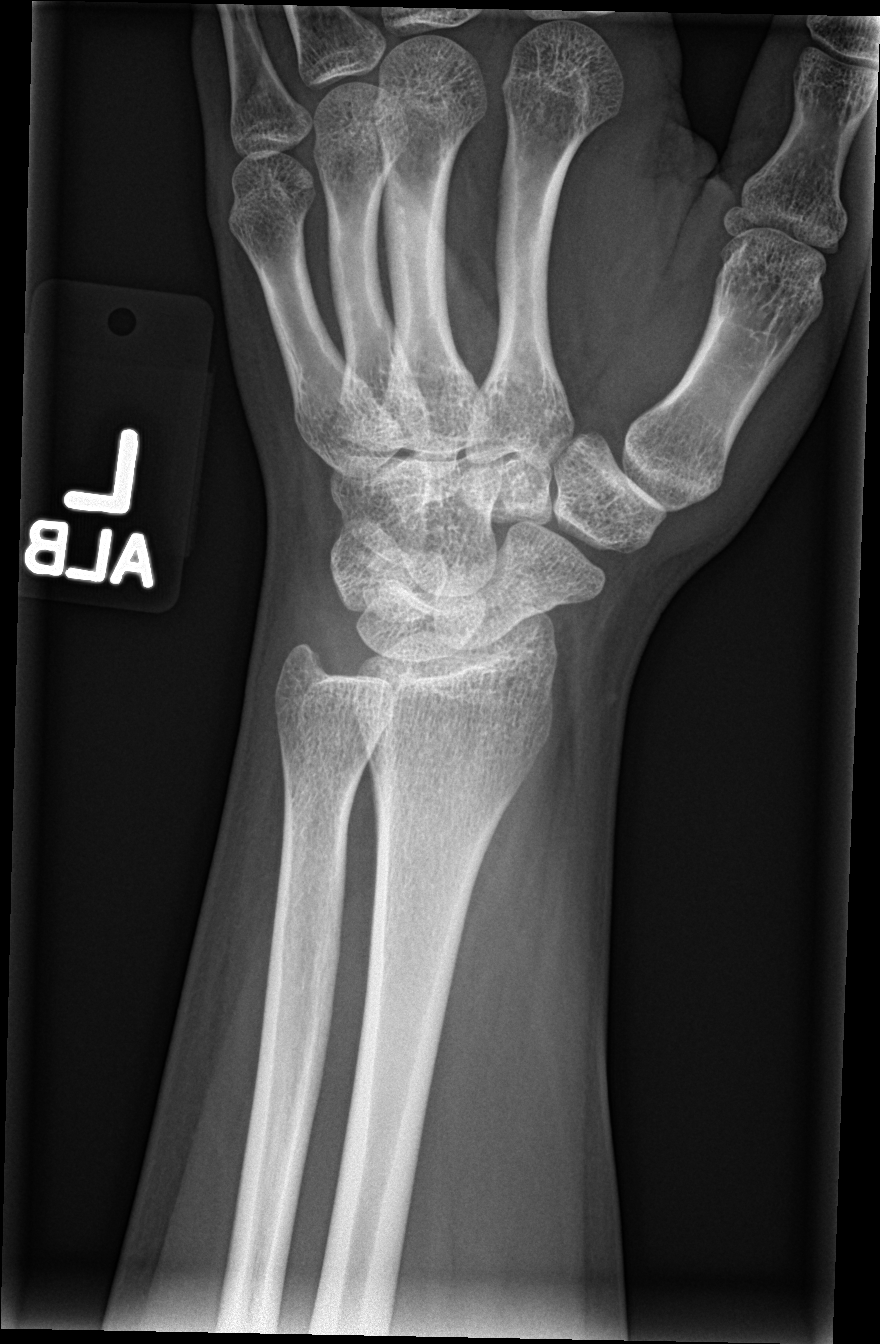

[wrist lat]
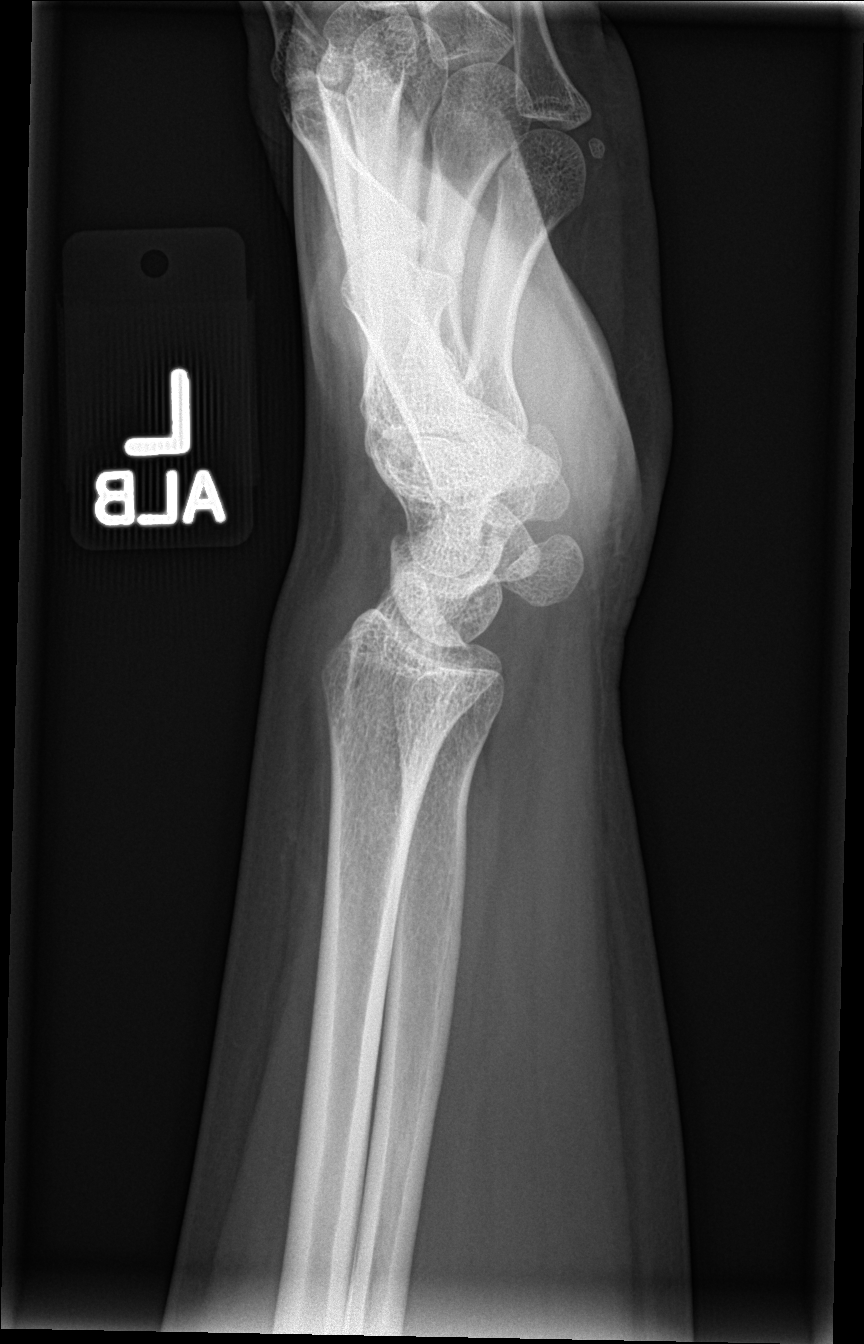

[wrist pa (2 of 2)]
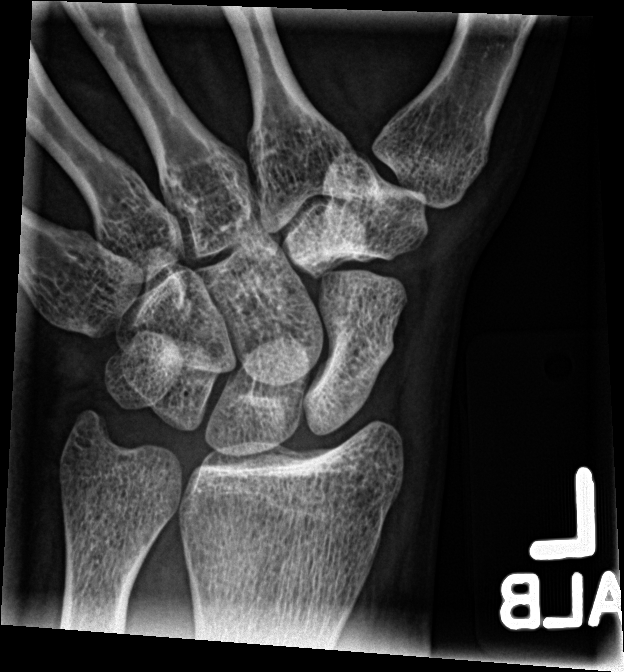

[4 of 4 positions shown; findings below may reference images not displayed]

FINDINGS: There is no evidence of fracture or dislocation. There is no
evidence of arthropathy or other focal bone abnormality. Soft
tissues are unremarkable.
IMPRESSION: Negative.
